# Patient Record
Sex: Male | Born: 1995 | Race: White | Hispanic: No | Marital: Single | Smoking: Never smoker
Health system: Southern US, Community
[De-identification: ages and names within clinical notes are randomized; demographics above are authoritative.]

## PROBLEM LIST (undated history)

## (undated) DIAGNOSIS — J45909 Unspecified asthma, uncomplicated: Secondary | ICD-10-CM

---
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
## 2017-08-16 ENCOUNTER — Emergency Department (HOSPITAL_BASED_OUTPATIENT_CLINIC_OR_DEPARTMENT_OTHER): Payer: 59

## 2017-08-16 ENCOUNTER — Encounter (HOSPITAL_BASED_OUTPATIENT_CLINIC_OR_DEPARTMENT_OTHER): Payer: Self-pay | Admitting: *Deleted

## 2017-08-16 ENCOUNTER — Emergency Department (HOSPITAL_BASED_OUTPATIENT_CLINIC_OR_DEPARTMENT_OTHER)
Admission: EM | Admit: 2017-08-16 | Discharge: 2017-08-16 | Disposition: A | Discharge status: Home / Self Care | Payer: 59 | Attending: Emergency Medicine | Admitting: Emergency Medicine

## 2017-08-16 DIAGNOSIS — S30810A Abrasion of lower back and pelvis, initial encounter: Secondary | ICD-10-CM

## 2017-08-16 DIAGNOSIS — Y999 Unspecified external cause status: Secondary | ICD-10-CM | POA: Insufficient documentation

## 2017-08-16 DIAGNOSIS — Y939 Activity, unspecified: Secondary | ICD-10-CM | POA: Insufficient documentation

## 2017-08-16 DIAGNOSIS — M25522 Pain in left elbow: Secondary | ICD-10-CM | POA: Insufficient documentation

## 2017-08-16 DIAGNOSIS — Y92410 Unspecified street and highway as the place of occurrence of the external cause: Secondary | ICD-10-CM | POA: Diagnosis not present

## 2017-08-16 DIAGNOSIS — R0602 Shortness of breath: Secondary | ICD-10-CM | POA: Diagnosis not present

## 2017-08-16 DIAGNOSIS — S3992XA Unspecified injury of lower back, initial encounter: Secondary | ICD-10-CM | POA: Diagnosis present

## 2017-08-16 HISTORY — DX: Unspecified asthma, uncomplicated: J45.909

## 2017-08-16 MED ORDER — IBUPROFEN 800 MG PO TABS
800.0000 mg | ORAL_TABLET | Freq: Once | ORAL | Status: AC
  Administered 2017-08-16: 800 mg via ORAL
  Filled 2017-08-16: qty 1

## 2017-08-16 MED ORDER — TRAMADOL HCL 50 MG PO TABS: 50.0000 mg | ORAL_TABLET | Freq: Four times a day (QID) | ORAL | 0 refills | Status: DC | PRN

## 2017-08-16 MED ORDER — SILVER SULFADIAZINE 1 % EX CREA: 1.0000 "application " | TOPICAL_CREAM | Freq: Every day | CUTANEOUS | 0 refills | Status: AC

## 2017-08-16 MED ORDER — IBUPROFEN 800 MG PO TABS: 800.0000 mg | ORAL_TABLET | Freq: Three times a day (TID) | ORAL | 0 refills | Status: AC | PRN

## 2017-08-16 NOTE — Discharge Instructions (Signed)
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
## 2021-04-16 ENCOUNTER — Other Ambulatory Visit: Payer: Self-pay

## 2021-04-16 ENCOUNTER — Emergency Department (HOSPITAL_COMMUNITY): Payer: Worker's Compensation | Admitting: Anesthesiology

## 2021-04-16 ENCOUNTER — Encounter (HOSPITAL_COMMUNITY)
Admission: EM | Disposition: A | Discharge status: Home / Self Care | Payer: Self-pay | Source: Home / Self Care | Attending: Emergency Medicine

## 2021-04-16 ENCOUNTER — Ambulatory Visit (HOSPITAL_COMMUNITY)
Admission: EM | Admit: 2021-04-16 | Discharge: 2021-04-16 | Disposition: A | Discharge status: Home / Self Care | Payer: Worker's Compensation | Attending: Emergency Medicine | Admitting: Emergency Medicine

## 2021-04-16 ENCOUNTER — Emergency Department (HOSPITAL_COMMUNITY): Payer: Worker's Compensation

## 2021-04-16 DIAGNOSIS — Z880 Allergy status to penicillin: Secondary | ICD-10-CM | POA: Diagnosis not present

## 2021-04-16 DIAGNOSIS — S62633B Displaced fracture of distal phalanx of left middle finger, initial encounter for open fracture: Secondary | ICD-10-CM | POA: Insufficient documentation

## 2021-04-16 DIAGNOSIS — Z23 Encounter for immunization: Secondary | ICD-10-CM | POA: Diagnosis not present

## 2021-04-16 DIAGNOSIS — W230XXA Caught, crushed, jammed, or pinched between moving objects, initial encounter: Secondary | ICD-10-CM | POA: Diagnosis not present

## 2021-04-16 DIAGNOSIS — Z20822 Contact with and (suspected) exposure to covid-19: Secondary | ICD-10-CM | POA: Diagnosis not present

## 2021-04-16 HISTORY — PX: I & D EXTREMITY: SHX5045

## 2021-04-16 LAB — RESP PANEL BY RT-PCR (FLU A&B, COVID) ARPGX2
Influenza A by PCR: NEGATIVE
Influenza B by PCR: NEGATIVE
SARS Coronavirus 2 by RT PCR: NEGATIVE

## 2021-04-16 SURGERY — IRRIGATION AND DEBRIDEMENT EXTREMITY
Anesthesia: General | Laterality: Left

## 2021-04-16 MED ORDER — PHENYLEPHRINE 40 MCG/ML (10ML) SYRINGE FOR IV PUSH (FOR BLOOD PRESSURE SUPPORT)
PREFILLED_SYRINGE | INTRAVENOUS | Status: DC | PRN
  Administered 2021-04-16: 80 ug via INTRAVENOUS

## 2021-04-16 MED ORDER — VANCOMYCIN HCL 1000 MG/200ML IV SOLN
1000.0000 mg | INTRAVENOUS | Status: AC
  Administered 2021-04-16: 1000 mg via INTRAVENOUS
  Filled 2021-04-16: qty 200

## 2021-04-16 MED ORDER — MIDAZOLAM HCL 2 MG/2ML IJ SOLN
INTRAMUSCULAR | Status: DC | PRN
  Administered 2021-04-16: 2 mg via INTRAVENOUS

## 2021-04-16 MED ORDER — MORPHINE SULFATE (PF) 4 MG/ML IV SOLN
4.0000 mg | Freq: Once | INTRAVENOUS | Status: AC
  Administered 2021-04-16: 4 mg via INTRAVENOUS
  Filled 2021-04-16: qty 1

## 2021-04-16 MED ORDER — ONDANSETRON HCL 4 MG/2ML IJ SOLN
INTRAMUSCULAR | Status: AC
  Filled 2021-04-16: qty 2

## 2021-04-16 MED ORDER — 0.9 % SODIUM CHLORIDE (POUR BTL) OPTIME
TOPICAL | Status: DC | PRN
  Administered 2021-04-16: 1000 mL

## 2021-04-16 MED ORDER — PROPOFOL 10 MG/ML IV BOLUS
INTRAVENOUS | Status: DC | PRN
  Administered 2021-04-16: 200 mg via INTRAVENOUS

## 2021-04-16 MED ORDER — PROPOFOL 10 MG/ML IV BOLUS
INTRAVENOUS | Status: AC
  Filled 2021-04-16: qty 20

## 2021-04-16 MED ORDER — LIDOCAINE 2% (20 MG/ML) 5 ML SYRINGE
INTRAMUSCULAR | Status: DC | PRN
  Administered 2021-04-16: 100 mg via INTRAVENOUS

## 2021-04-16 MED ORDER — ONDANSETRON HCL 4 MG/2ML IJ SOLN
INTRAMUSCULAR | Status: DC | PRN
  Administered 2021-04-16: 4 mg via INTRAVENOUS

## 2021-04-16 MED ORDER — FENTANYL CITRATE (PF) 250 MCG/5ML IJ SOLN
INTRAMUSCULAR | Status: AC
Start: 2021-04-16 — End: ?
  Filled 2021-04-16: qty 5

## 2021-04-16 MED ORDER — CEFAZOLIN SODIUM-DEXTROSE 1-4 GM/50ML-% IV SOLN
1.0000 g | Freq: Once | INTRAVENOUS | Status: AC
  Administered 2021-04-16: 1 g via INTRAVENOUS
  Filled 2021-04-16: qty 50

## 2021-04-16 MED ORDER — FENTANYL CITRATE (PF) 250 MCG/5ML IJ SOLN
INTRAMUSCULAR | Status: DC | PRN
  Administered 2021-04-16 (×2): 50 ug via INTRAVENOUS

## 2021-04-16 MED ORDER — TETANUS-DIPHTH-ACELL PERTUSSIS 5-2.5-18.5 LF-MCG/0.5 IM SUSY
0.5000 mL | PREFILLED_SYRINGE | Freq: Once | INTRAMUSCULAR | Status: AC
  Administered 2021-04-16: 0.5 mL via INTRAMUSCULAR
  Filled 2021-04-16: qty 0.5

## 2021-04-16 MED ORDER — HYDROCODONE-ACETAMINOPHEN 5-325 MG PO TABS: ORAL_TABLET | ORAL | 0 refills | Status: AC

## 2021-04-16 MED ORDER — DEXAMETHASONE SODIUM PHOSPHATE 10 MG/ML IJ SOLN
INTRAMUSCULAR | Status: AC
  Filled 2021-04-16: qty 1

## 2021-04-16 MED ORDER — EPHEDRINE SULFATE-NACL 50-0.9 MG/10ML-% IV SOSY
PREFILLED_SYRINGE | INTRAVENOUS | Status: DC | PRN
  Administered 2021-04-16 (×2): 5 mg via INTRAVENOUS

## 2021-04-16 MED ORDER — BUPIVACAINE HCL (PF) 0.25 % IJ SOLN
INTRAMUSCULAR | Status: AC
  Filled 2021-04-16: qty 30

## 2021-04-16 MED ORDER — LACTATED RINGERS IV SOLN: INTRAVENOUS | Status: DC | PRN

## 2021-04-16 MED ORDER — DEXAMETHASONE SODIUM PHOSPHATE 10 MG/ML IJ SOLN
INTRAMUSCULAR | Status: DC | PRN
  Administered 2021-04-16: 10 mg via INTRAVENOUS

## 2021-04-16 MED ORDER — BUPIVACAINE HCL (PF) 0.25 % IJ SOLN
INTRAMUSCULAR | Status: DC | PRN
  Administered 2021-04-16: 10 mL

## 2021-04-16 MED ORDER — SODIUM CHLORIDE 0.9 % IV SOLN: INTRAVENOUS | Status: DC

## 2021-04-16 MED ORDER — MIDAZOLAM HCL 2 MG/2ML IJ SOLN
INTRAMUSCULAR | Status: AC
  Filled 2021-04-16: qty 2

## 2021-04-16 MED ORDER — SULFAMETHOXAZOLE-TRIMETHOPRIM 800-160 MG PO TABS: 1.0000 | ORAL_TABLET | Freq: Two times a day (BID) | ORAL | 0 refills | Status: AC

## 2021-04-16 SURGICAL SUPPLY — 77 items
ADAPTER CATH SYR TO TUBING 38M (ADAPTER) ×1 IMPLANT
ADPR CATH LL SYR 3/32 TPR (ADAPTER)
APL PRP STRL LF DISP 70% ISPRP (MISCELLANEOUS) ×1
APL SKNCLS STERI-STRIP NONHPOA (GAUZE/BANDAGES/DRESSINGS)
BENZOIN TINCTURE PRP APPL 2/3 (GAUZE/BANDAGES/DRESSINGS) ×1 IMPLANT
BLADE CLIPPER SURG (BLADE) ×2 IMPLANT
BNDG CMPR 9X4 STRL LF SNTH (GAUZE/BANDAGES/DRESSINGS) ×1
BNDG COHESIVE 2X5 TAN STRL LF (GAUZE/BANDAGES/DRESSINGS) IMPLANT
BNDG COHESIVE 3X5 TAN STRL LF (GAUZE/BANDAGES/DRESSINGS) ×1 IMPLANT
BNDG ELASTIC 3X5.8 VLCR STR LF (GAUZE/BANDAGES/DRESSINGS) ×2 IMPLANT
BNDG ELASTIC 4X5.8 VLCR STR LF (GAUZE/BANDAGES/DRESSINGS) ×2 IMPLANT
BNDG ESMARK 4X9 LF (GAUZE/BANDAGES/DRESSINGS) ×2 IMPLANT
BNDG GAUZE ELAST 4 BULKY (GAUZE/BANDAGES/DRESSINGS) ×2 IMPLANT
CANNULA VESSEL 3MM 2 BLNT TIP (CANNULA) IMPLANT
CHLORAPREP W/TINT 26 (MISCELLANEOUS) ×2 IMPLANT
CORD BIPOLAR FORCEPS 12FT (ELECTRODE) ×2 IMPLANT
COVER SURGICAL LIGHT HANDLE (MISCELLANEOUS) ×1 IMPLANT
COVER WAND RF STERILE (DRAPES) ×2 IMPLANT
CUFF TOURN SGL QUICK 18X4 (TOURNIQUET CUFF) ×1 IMPLANT
CUFF TOURN SGL QUICK 24 (TOURNIQUET CUFF)
CUFF TRNQT CYL 24X4X16.5-23 (TOURNIQUET CUFF) IMPLANT
DECANTER SPIKE VIAL GLASS SM (MISCELLANEOUS) ×2 IMPLANT
DRAIN PENROSE 1/4X12 LTX STRL (WOUND CARE) IMPLANT
DRAPE 3/4 80X56 (DRAPES) ×1 IMPLANT
DRAPE OEC MINIVIEW 54X84 (DRAPES) ×3 IMPLANT
DRAPE SURG 17X23 STRL (DRAPES) ×2 IMPLANT
DRSG EMULSION OIL 3X3 NADH (GAUZE/BANDAGES/DRESSINGS) ×2 IMPLANT
DRSG PAD ABDOMINAL 8X10 ST (GAUZE/BANDAGES/DRESSINGS) ×2 IMPLANT
DRSG XEROFORM 1X8 (GAUZE/BANDAGES/DRESSINGS) ×1 IMPLANT
GAUZE SPONGE 4X4 12PLY STRL (GAUZE/BANDAGES/DRESSINGS) ×3 IMPLANT
GAUZE XEROFORM 1X8 LF (GAUZE/BANDAGES/DRESSINGS) ×3 IMPLANT
GLOVE BIO SURGEON STRL SZ7.5 (GLOVE) ×2 IMPLANT
GLOVE BIOGEL PI IND STRL 8.5 (GLOVE) IMPLANT
GLOVE BIOGEL PI INDICATOR 8.5 (GLOVE) ×1
GLOVE BIOGEL PI ORTHO PRO SZ8 (GLOVE)
GLOVE PI ORTHO PRO STRL SZ8 (GLOVE) IMPLANT
GLOVE SRG 8 PF TXTR STRL LF DI (GLOVE) ×1 IMPLANT
GLOVE SURG ORTHO 8.0 STRL STRW (GLOVE) IMPLANT
GLOVE SURG UNDER POLY LF SZ8 (GLOVE) ×2
GOWN STRL REUS W/ TWL LRG LVL3 (GOWN DISPOSABLE) ×3 IMPLANT
GOWN STRL REUS W/ TWL XL LVL3 (GOWN DISPOSABLE) ×1 IMPLANT
GOWN STRL REUS W/TWL LRG LVL3 (GOWN DISPOSABLE) ×4
GOWN STRL REUS W/TWL XL LVL3 (GOWN DISPOSABLE) ×2
KIT BASIN OR (CUSTOM PROCEDURE TRAY) ×2 IMPLANT
KIT TURNOVER KIT B (KITS) ×2 IMPLANT
LOOP VESSEL MAXI BLUE (MISCELLANEOUS) IMPLANT
MANIFOLD NEPTUNE II (INSTRUMENTS) ×1 IMPLANT
NDL HYPO 25GX1X1/2 BEV (NEEDLE) ×1 IMPLANT
NDL HYPO 25X1 1.5 SAFETY (NEEDLE) IMPLANT
NEEDLE HYPO 25GX1X1/2 BEV (NEEDLE) ×2 IMPLANT
NEEDLE HYPO 25X1 1.5 SAFETY (NEEDLE) IMPLANT
NS IRRIG 1000ML POUR BTL (IV SOLUTION) ×2 IMPLANT
PACK ORTHO EXTREMITY (CUSTOM PROCEDURE TRAY) ×2 IMPLANT
PAD ARMBOARD 7.5X6 YLW CONV (MISCELLANEOUS) ×4 IMPLANT
SET CYSTO W/LG BORE CLAMP LF (SET/KITS/TRAYS/PACK) IMPLANT
SOL PREP POV-IOD 4OZ 10% (MISCELLANEOUS) ×4 IMPLANT
SPONGE LAP 4X18 RFD (DISPOSABLE) ×2 IMPLANT
STRIP CLOSURE SKIN 1/2X4 (GAUZE/BANDAGES/DRESSINGS) ×2 IMPLANT
SUCTION FRAZIER HANDLE 10FR (MISCELLANEOUS) ×2
SUCTION TUBE FRAZIER 10FR DISP (MISCELLANEOUS) ×1 IMPLANT
SUT CHROMIC 6 0 PS 4 (SUTURE) ×1 IMPLANT
SUT ETHILON 4 0 P 3 18 (SUTURE) IMPLANT
SUT ETHILON 4 0 PS 2 18 (SUTURE) IMPLANT
SUT MON AB 5-0 P3 18 (SUTURE) IMPLANT
SUT MON AB 5-0 PS2 18 (SUTURE) ×1 IMPLANT
SUT PROLENE 4 0 P 3 18 (SUTURE) IMPLANT
SWAB COLLECTION DEVICE MRSA (MISCELLANEOUS) IMPLANT
SWAB CULTURE ESWAB REG 1ML (MISCELLANEOUS) IMPLANT
SYR 20ML LL LF (SYRINGE) ×1 IMPLANT
SYR CONTROL 10ML LL (SYRINGE) ×2 IMPLANT
TOWEL GREEN STERILE (TOWEL DISPOSABLE) ×2 IMPLANT
TOWEL GREEN STERILE FF (TOWEL DISPOSABLE) ×2 IMPLANT
TUBE CONNECTING 12X1/4 (SUCTIONS) ×2 IMPLANT
TUBE FEEDING ENTERAL 5FR 16IN (TUBING) IMPLANT
UNDERPAD 30X36 HEAVY ABSORB (UNDERPADS AND DIAPERS) ×2 IMPLANT
WATER STERILE IRR 1000ML POUR (IV SOLUTION) ×2 IMPLANT
YANKAUER SUCT BULB TIP NO VENT (SUCTIONS) ×2 IMPLANT

## 2021-04-16 NOTE — Transfer of Care (Signed)
Immediate Anesthesia Transfer of Care Note  Patient: Kenneth Moses  Procedure(s) Performed: IRRIGATION AND DEBRIDEMENT LEFT MIDDLE FINGER REPAIR OF SKIN AND NAIL BED (Left )  Patient Location: PACU  Anesthesia Type:General  Level of Consciousness: awake, alert  and patient cooperative  Airway & Oxygen Therapy: Patient Spontanous Breathing and Patient connected to face mask oxygen  Post-op Assessment: Report given to RN and Post -op Vital signs reviewed and stable  Post vital signs: Reviewed and stable  Last Vitals:  Vitals Value Taken Time  BP 143/94 04/16/21 0906  Temp    Pulse 94 04/16/21 0907  Resp 14 04/16/21 0907  SpO2 100 % 04/16/21 0907  Vitals shown include unvalidated device data.  Last Pain:  Vitals:   04/16/21 0204  TempSrc:   PainSc: 3          Complications: No complications documented.

## 2021-04-16 NOTE — Progress Notes (Signed)
Orthopedic Tech Progress Note Patient Details:  Kenneth Moses Jun 25, 1996 [id number] Left sling at bedside Ortho Devices Type of Ortho Device: Arm sling Ortho Device/Splint Interventions: Ordered       Arnold Depinto A Isebella Upshur 04/16/2021, 10:03 AM

## 2021-04-16 NOTE — Op Note (Signed)
NAMERenly Roots MEDICAL RECORD NO: [id number] DATE OF BIRTH: October 17, 1996 FACILITY: [facility] LOCATION: [facility] OR PHYSICIAN: Tami Ribas, MD   OPERATIVE REPORT   DATE OF PROCEDURE: 04/16/21    PREOPERATIVE DIAGNOSIS:  left long finger crush injury   POSTOPERATIVE DIAGNOSIS:   1.  Left long finger open distal phalanx fracture 2.  Left long finger skin and nail bed lacerations   PROCEDURE:   1.  Irrigation and debridement left long finger open distal phalanx fracture 2. Open reduction left long finger open distal phalanx fracture 3. Left long finger repair skin and nail bed lacerations   SURGEON:  Betha Loa, M.D.   ASSISTANT: none   ANESTHESIA:  General   INTRAVENOUS FLUIDS:  Per anesthesia flow sheet.   ESTIMATED BLOOD LOSS:  Minimal.   COMPLICATIONS:  None.   SPECIMENS:  none   TOURNIQUET TIME:    Total Tourniquet Time Documented: Upper Arm (Left) - 40 minutes Total: Upper Arm (Left) - 40 minutes    DISPOSITION:  Stable to PACU.   INDICATIONS:  25 yo rhd male states he crushed left long finger with a tool at work early this morning.  Seen at [facility] where XR revealed distal phalanx fracture with comminution.  Skin and nail bed lacerations.    Recommended operative reduction and repair in the operating room with possible pinning.  Risks, benefits and alternatives of surgery were discussed including the risks of blood loss, infection, damage to nerves, vessels, tendons, ligaments, bone for surgery, need for additional surgery, complications with wound healing, continued pain, nonunion, malunion,  stiffness.  He voiced understanding of these risks and elected to proceed.  OPERATIVE COURSE:  After being identified preoperatively by myself,  the patient and I agreed on the procedure and site of the procedure.  The surgical site was marked.  Surgical consent had been signed. He was given IV antibiotics as preoperative antibiotic prophylaxis. He was transferred to the operating  room and placed on the operating table in supine position with the Left upper extremity on an arm board.  General anesthesia was induced by the anesthesiologist.  Left upper extremity was prepped and draped in normal sterile orthopedic fashion.  A surgical pause was performed between the surgeons, anesthesia, and operating room staff and all were in agreement as to the patient, procedure, and site of procedure.  Tourniquet at the proximal aspect of the extremity was inflated to 250 mmHg after exsanguination of the arm with an Esmarch bandage.  A digital block was performed with 10 cc of quarter percent plain Marcaine.  The wound was explored.  There was no gross contamination.  The pickups and knife were used to debride the open fracture including skin subcutaneous tissues and tissue deep to fascia.  The knife was used to remove any devitalized tissue.  Contaminated hematoma was removed.  The wound was copiously irrigated with sterile saline by bulb syringe.  A reduction of the distal phalanx fracture was performed under direct visualization.  There was too much comminution of the distal fragments were pending.  The skin was reapproximated using 5-0 Monocryl in an interrupted fashion.  6-0 chromic suture was used to reapproximate the nailbed.  Good tension-free reapproximation was obtained.  Two relaxing incisions were made in the dorsal nail fold to allow visualization.  These were repaired with the 6-0 chromic as well.  A piece of Xeroform was placed in the nail fold and the wound dressed with sterile Xeroform and 4 x 4 and  wrapped with a Coban dressing lightly.  An AlumaFoam splint was placed and wrapped lightly with Coban dressing.  The tourniquet was deflated at 40 minutes.  Fingertips were pink with brisk capillary refill after deflation of tourniquet.  The operative  drapes were broken down.  The patient was awoken from anesthesia safely.  He was transferred back to the stretcher and taken to PACU in stable  condition.  I will see him back in the office in 1 week for postoperative followup.  I will give him a prescription for Norco 5/325 1-2 tabs PO q6 hours prn pain, dispense # 20 and Bactrim DS 1 p.o. twice daily x7 days.  Debridement type: Excisional Debridement  Side: left  Body Location: Long finger  Tools used for debridement: scalpel  Debridement depth beyond dead/damaged tissue down to healthy viable tissue: yes  Tissue layer involved: skin, subcutaneous tissue, muscle / fascia  Nature of tissue removed: Devitalized Tissue  Irrigation volume: 800 cc     Irrigation fluid type: Normal Saline   Betha Loa, MD Electronically signed, 04/16/21

## 2021-04-16 NOTE — ED Notes (Signed)
OR came to pick pt up.  All belongings given to mom.  Fluids stopped per provider.

## 2021-04-16 NOTE — ED Notes (Signed)
Pt has been changed into a gown, belongings are bagged and labeled bedside.  Pt last ate 1/2 peanut butter sandwich about midnight.  He has called his Dad and arranged a ride home after the surgery.

## 2021-04-16 NOTE — ED Notes (Signed)
Report given to Danae Orleans

## 2021-04-16 NOTE — Anesthesia Postprocedure Evaluation (Signed)
Anesthesia Post Note  Patient: Kenneth Moses  Procedure(s) Performed: IRRIGATION AND DEBRIDEMENT LEFT MIDDLE FINGER REPAIR OF SKIN AND NAIL BED (Left )     Patient location during evaluation: PACU Anesthesia Type: General Level of consciousness: awake and alert and oriented Pain management: pain level controlled Vital Signs Assessment: post-procedure vital signs reviewed and stable Respiratory status: spontaneous breathing, nonlabored ventilation and respiratory function stable Cardiovascular status: blood pressure returned to baseline and stable Postop Assessment: no apparent nausea or vomiting Anesthetic complications: no   No complications documented.  Last Vitals:  Vitals:   04/16/21 0921 04/16/21 0936  BP: 131/87 (!) 145/88  Pulse: 92 (!) 101  Resp: 14 15  Temp:  36.4 C  SpO2: 96% 95%    Last Pain:  Vitals:   04/16/21 0936  TempSrc:   PainSc: 0-No pain                 Landynn Dupler A.

## 2021-04-16 NOTE — ED Provider Notes (Signed)
[facility] EMERGENCY DEPARTMENT Provider Note   CSN: [id number] Arrival date & time: 04/16/21  0148     History Chief Complaint  Patient presents with  . Finger Injury    Kace Hartje is a 25 y.o. male.  Patient is a 25 year old male with history of asthma.  He presents with complaints of a left middle finger injury.  Patient was working on an [employer] and the air tool kicked back and injured his fingertip.  Bleeding controlled with direct pressure.  Pain is worse with any movement.  There are no alleviating factors.  The history is provided by the patient.       Past Medical History:  Diagnosis Date  . Asthma     There are no problems to display for this patient.   No past surgical history on file.     No family history on file.  Social History   Tobacco Use  . Smoking status: Never Smoker  . Smokeless tobacco: Never Used  Substance Use Topics  . Alcohol use: No  . Drug use: No    Home Medications Prior to Admission medications   Medication Sig Start Date End Date Taking? Authorizing Provider  ibuprofen (ADVIL,MOTRIN) 800 MG tablet Take 1 tablet (800 mg total) by mouth every 8 (eight) hours as needed. 08/16/17   Long, Arlyss Repress, MD  silver sulfADIAZINE (SILVADENE) 1 % cream Apply 1 application topically daily. 08/16/17   Long, Arlyss Repress, MD  traMADol (ULTRAM) 50 MG tablet Take 1 tablet (50 mg total) by mouth every 6 (six) hours as needed. 08/16/17   Long, Arlyss Repress, MD    Allergies    Penicillins  Review of Systems   Review of Systems  All other systems reviewed and are negative.   Physical Exam Updated Vital Signs BP (!) 147/100 (BP Location: Right Arm)   Pulse 69   Temp 98.4 F (36.9 C) (Oral)   Resp 17   Ht 5\' 11"  (1.803 m)   Wt 95.3 kg   SpO2 98%   BMI 29.29 kg/m   Physical Exam Vitals and nursing note reviewed.  Constitutional:      General: He is not in acute distress.    Appearance: Normal appearance. He is not  ill-appearing.  HENT:     Head: Normocephalic and atraumatic.  Pulmonary:     Effort: Pulmonary effort is normal.  Musculoskeletal:     Comments: The left middle finger has a fingertip avulsion.  The base of the nail has been avulsed and the tip of the finger is unstable  Skin:    General: Skin is warm and dry.  Neurological:     Mental Status: He is alert.     ED Results / Procedures / Treatments   Labs (all labs ordered are listed, but only abnormal results are displayed) Labs Reviewed - No data to display  EKG None  Radiology DG Finger Middle Left  Result Date: 04/16/2021 CLINICAL DATA:  Crush injury third digit EXAM: LEFT MIDDLE FINGER 2+V COMPARISON:  None. FINDINGS: Frontal, oblique, and lateral views of the left third digit are obtained. There is an open comminuted fracture of the distal tuft of the third distal phalanx. Moderate separation of the fracture fragments, with dorsal angulation at the fracture site. Large dorsal soft tissue defect. No other acute bony abnormalities. IMPRESSION: 1. Open comminuted fracture of the third distal phalanx. Electronically Signed   By: 06/16/2021 M.D.   On: 04/16/2021 02:28  Procedures Procedures   Medications Ordered in ED Medications - No data to display  ED Course  I have reviewed the triage vital signs and the nursing notes.  Pertinent labs & imaging results that were available during my care of the patient were reviewed by me and considered in my medical decision making (see chart for details).    MDM Rules/Calculators/A&P  Patient presenting with injury to left middle finger.  He has an open fracture of the distal phalanx with avulsion of the nailbed.  X-rays confirm fracture.  The tip of the finger is somewhat unstable.  Care has been discussed with Dr. Merlyn Lot from hand surgery.  He is recommending keeping the patient in the ER and will see about repair in the OR in the morning.  Patient given Kefzol and tetanus  updated.  Final Clinical Impression(s) / ED Diagnoses Final diagnoses:  None    Rx / DC Orders ED Discharge Orders    None       Geoffery Lyons, MD 04/16/21 [phone]

## 2021-04-16 NOTE — Discharge Instructions (Addendum)

## 2021-04-16 NOTE — H&P (Signed)
Kenneth Moses is an 25 y.o. male.   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  Seen at [facility] where XR revealed distal phalanx fracture.  He reports no previous injury to the finger and no other injury at this time.  There is associated wound and bleeding of left long finger  Case discussed with Geoffery Lyons, MD and his note from 04/16/2021 reviewed. Xrays viewed and interpreted by me: ap/lateral views left long finger show distal phalanx tuft fracture with comminution. Labs reviewed: none  Allergies:  Allergies  Allergen Reactions  . Penicillins     Past Medical History:  Diagnosis Date  . Asthma     No past surgical history on file.  Family History: No family history on file.  Social History:   reports that he has never smoked. He has never used smokeless tobacco. He reports that he does not drink alcohol and does not use drugs.  Medications: Medications Prior to Admission  Medication Sig Dispense Refill  . ibuprofen (ADVIL,MOTRIN) 800 MG tablet Take 1 tablet (800 mg total) by mouth every 8 (eight) hours as needed. 21 tablet 0  . silver sulfADIAZINE (SILVADENE) 1 % cream Apply 1 application topically daily. 50 g 0  . traMADol (ULTRAM) 50 MG tablet Take 1 tablet (50 mg total) by mouth every 6 (six) hours as needed. 15 tablet 0    Results for orders placed or performed during the hospital encounter of 04/16/21 (from the past 48 hour(s))  Resp Panel by RT-PCR (Flu A&B, Covid) Nasopharyngeal Swab     Status: None   Collection Time: 04/16/21  5:26 AM   Specimen: Nasopharyngeal Swab; Nasopharyngeal(NP) swabs in vial transport medium  Result Value Ref Range   SARS Coronavirus 2 by RT PCR NEGATIVE NEGATIVE    Comment: (NOTE) SARS-CoV-2 target nucleic acids are NOT DETECTED.  The SARS-CoV-2 RNA is generally detectable in upper respiratory specimens during the acute phase of infection. The  lowest concentration of SARS-CoV-2 viral copies this assay can detect is 138 copies/mL. A negative result does not preclude SARS-Cov-2 infection and should not be used as the sole basis for treatment or other patient management decisions. A negative result may occur with  improper specimen collection/handling, submission of specimen other than nasopharyngeal swab, presence of viral mutation(s) within the areas targeted by this assay, and inadequate number of viral copies(<138 copies/mL). A negative result must be combined with clinical observations, patient history, and epidemiological information. The expected result is Negative.  Fact Sheet for Patients:  BloggerCourse.com  Fact Sheet for Healthcare Providers:  SeriousBroker.it  This test is no t yet approved or cleared by the [place] FDA and  has been authorized for detection and/or diagnosis of SARS-CoV-2 by FDA under an Emergency Use Authorization (EUA). This EUA will remain  in effect (meaning this test can be used) for the duration of the COVID-19 declaration under Section 564(b)(1) of the Act, 21 U.S.C.section 360bbb-3(b)(1), unless the authorization is terminated  or revoked sooner.       Influenza A by PCR NEGATIVE NEGATIVE   Influenza B by PCR NEGATIVE NEGATIVE    Comment: (NOTE) The Xpert Xpress SARS-CoV-2/FLU/RSV plus assay is intended as an aid in the diagnosis of influenza from Nasopharyngeal swab specimens and should not be used as a sole basis for treatment. Nasal washings and aspirates are unacceptable for Xpert Xpress SARS-CoV-2/FLU/RSV testing.  Fact Sheet  for Patients: BloggerCourse.com  Fact Sheet for Healthcare Providers: SeriousBroker.it  This test is not yet approved or cleared by the [place] FDA and has been authorized for detection and/or diagnosis of SARS-CoV-2 by FDA under an Emergency  Use Authorization (EUA). This EUA will remain in effect (meaning this test can be used) for the duration of the COVID-19 declaration under Section 564(b)(1) of the Act, 21 U.S.C. section 360bbb-3(b)(1), unless the authorization is terminated or revoked.  Performed at [facility], [address], [place], [place] [address]     DG Finger Middle Left  Result Date: 04/16/2021 CLINICAL DATA:  Crush injury third digit EXAM: LEFT MIDDLE FINGER 2+V COMPARISON:  None. FINDINGS: Frontal, oblique, and lateral views of the left third digit are obtained. There is an open comminuted fracture of the distal tuft of the third distal phalanx. Moderate separation of the fracture fragments, with dorsal angulation at the fracture site. Large dorsal soft tissue defect. No other acute bony abnormalities. IMPRESSION: 1. Open comminuted fracture of the third distal phalanx. Electronically Signed   By: Sharlet Salina M.D.   On: 04/16/2021 02:28     A comprehensive review of systems was negative. Review of Systems: No fevers, chills, night sweats, chest pain, shortness of breath, nausea, vomiting, diarrhea, constipation, easy bleeding or bruising, headaches, dizziness, vision changes, fainting.   Blood pressure 132/86, pulse 73, temperature 98.4 F (36.9 C), temperature source Oral, resp. rate 16, height 5\' 11"  (1.803 m), weight 95.3 kg, SpO2 98 %.  General appearance: alert, cooperative and appears stated age Head: Normocephalic, without obvious abnormality, atraumatic Neck: supple, symmetrical, trachea midline Resp: clear to auscultation bilaterally Cardio: regular rate and rhythm Extremities: Intact sensation and capillary refill all digits.  +epl/fpl/io.  Wound of left long finger including nail bed and radial and ulnar tissues.  Able to feel light touch in tip of finger.  Intact capillary refill. Pulses: 2+ and symmetric Skin: Skin color, texture, turgor normal. No rashes or lesions Neurologic:  Grossly normal Incision/Wound: as above  Assessment/Plan Left long finger crush injury.  Recommend OR for irrigation and debridement possible pinning with repair skin and nail bed left long finger.  Risks, benefits and alternatives of surgery were discussed including risks of blood loss, infection, damage to nerves/vessels/tendons/ligament/bone, failure of surgery, need for additional surgery, complication with wound healing, stiffness, nonunion, malunion.  He voiced understanding of these risks and elected to proceed.    04/16/2021, 7:37 AM

## 2021-04-16 NOTE — ED Triage Notes (Signed)
Left middle finger got crushed while at work. Finger got stuck in between the gun and engine (works as [employer]).   Updated on tetanus.

## 2021-04-16 NOTE — Anesthesia Preprocedure Evaluation (Signed)
Anesthesia Evaluation  Patient identified by MRN, date of birth, ID band Patient awake    Reviewed: Allergy & Precautions, NPO status , Patient's Chart, lab work & pertinent test results  Airway Mallampati: II  TM Distance: >3 FB Neck ROM: Full    Dental no notable dental hx. (+) Teeth Intact   Pulmonary asthma ,    Pulmonary exam normal breath sounds clear to auscultation       Cardiovascular negative cardio ROS Normal cardiovascular exam Rhythm:Regular Rate:Normal     Neuro/Psych negative neurological ROS  negative psych ROS   GI/Hepatic negative GI ROS, Neg liver ROS,   Endo/Other  negative endocrine ROS  Renal/GU negative Renal ROS  negative genitourinary   Musculoskeletal Crush injury left middle finger   Abdominal   Peds  Hematology negative hematology ROS (+)   Anesthesia Other Findings   Reproductive/Obstetrics                             Anesthesia Physical Anesthesia Plan  ASA: II  Anesthesia Plan: General   Post-op Pain Management:    Induction: Intravenous  PONV Risk Score and Plan: 3 and Treatment may vary due to age or medical condition, Midazolam and Ondansetron  Airway Management Planned: LMA  Additional Equipment:   Intra-op Plan:   Post-operative Plan: Extubation in OR  Informed Consent: I have reviewed the patients History and Physical, chart, labs and discussed the procedure including the risks, benefits and alternatives for the proposed anesthesia with the patient or authorized representative who has indicated his/her understanding and acceptance.     Dental advisory given  Plan Discussed with: CRNA and Anesthesiologist  Anesthesia Plan Comments:         Anesthesia Quick Evaluation

## 2021-04-16 NOTE — Anesthesia Procedure Notes (Signed)
Procedure Name: LMA Insertion Date/Time: 04/16/2021 7:50 AM Performed by: Carlos American, CRNA Pre-anesthesia Checklist: Patient identified, Emergency Drugs available, Suction available and Patient being monitored Patient Re-evaluated:Patient Re-evaluated prior to induction Oxygen Delivery Method: Circle System Utilized Preoxygenation: Pre-oxygenation with 100% oxygen Induction Type: IV induction Ventilation: Mask ventilation without difficulty LMA: LMA inserted LMA Size: 5.0 Number of attempts: 1 Placement Confirmation: positive ETCO2 Tube secured with: Tape Dental Injury: Teeth and Oropharynx as per pre-operative assessment

## 2021-04-17 ENCOUNTER — Encounter (HOSPITAL_COMMUNITY): Payer: Self-pay | Admitting: Orthopedic Surgery

## 2022-08-05 IMAGING — DX DG FINGER MIDDLE 2+V*L*
3 series · 3 of 3 positions shown · non-contrast
Comparison: None.

CLINICAL DATA: Crush injury third digit

EXAM:
LEFT MIDDLE FINGER 2+V

[finger ap]
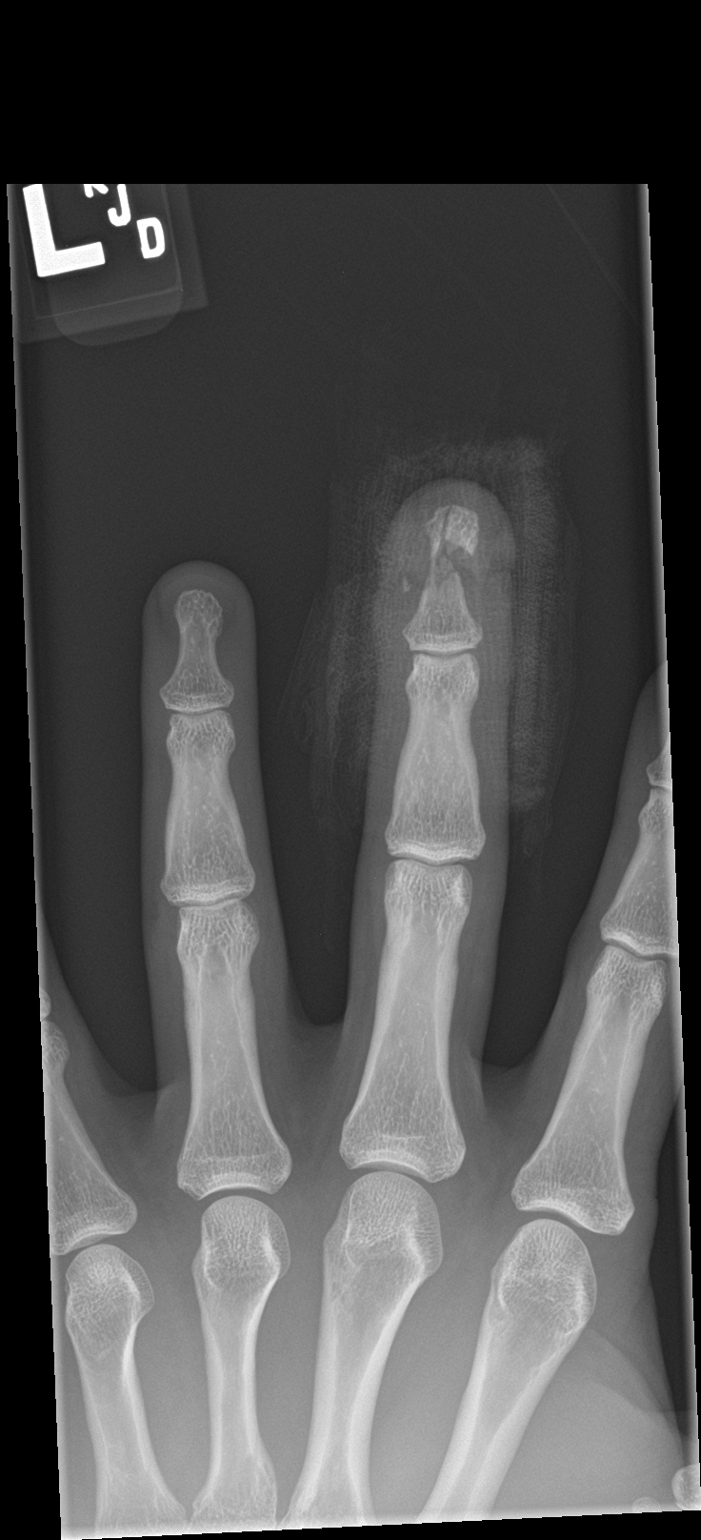

[finger obl]
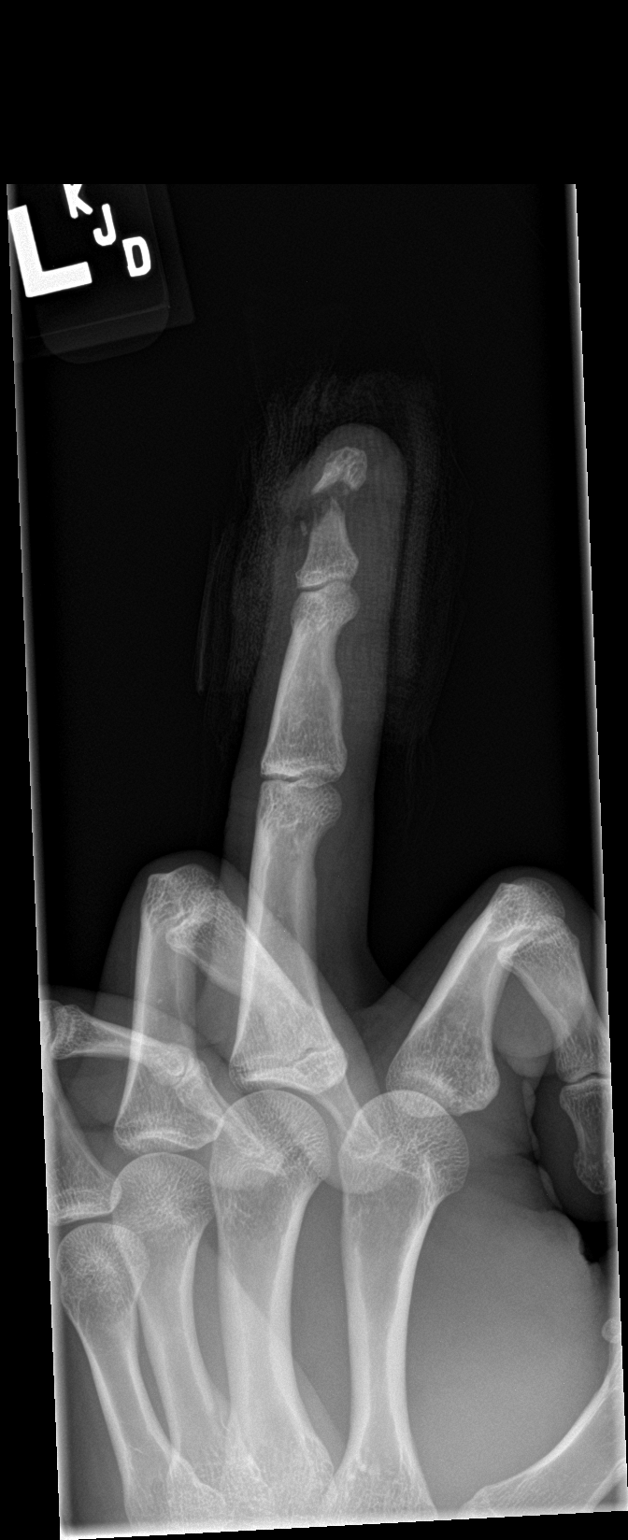

[finger lat]
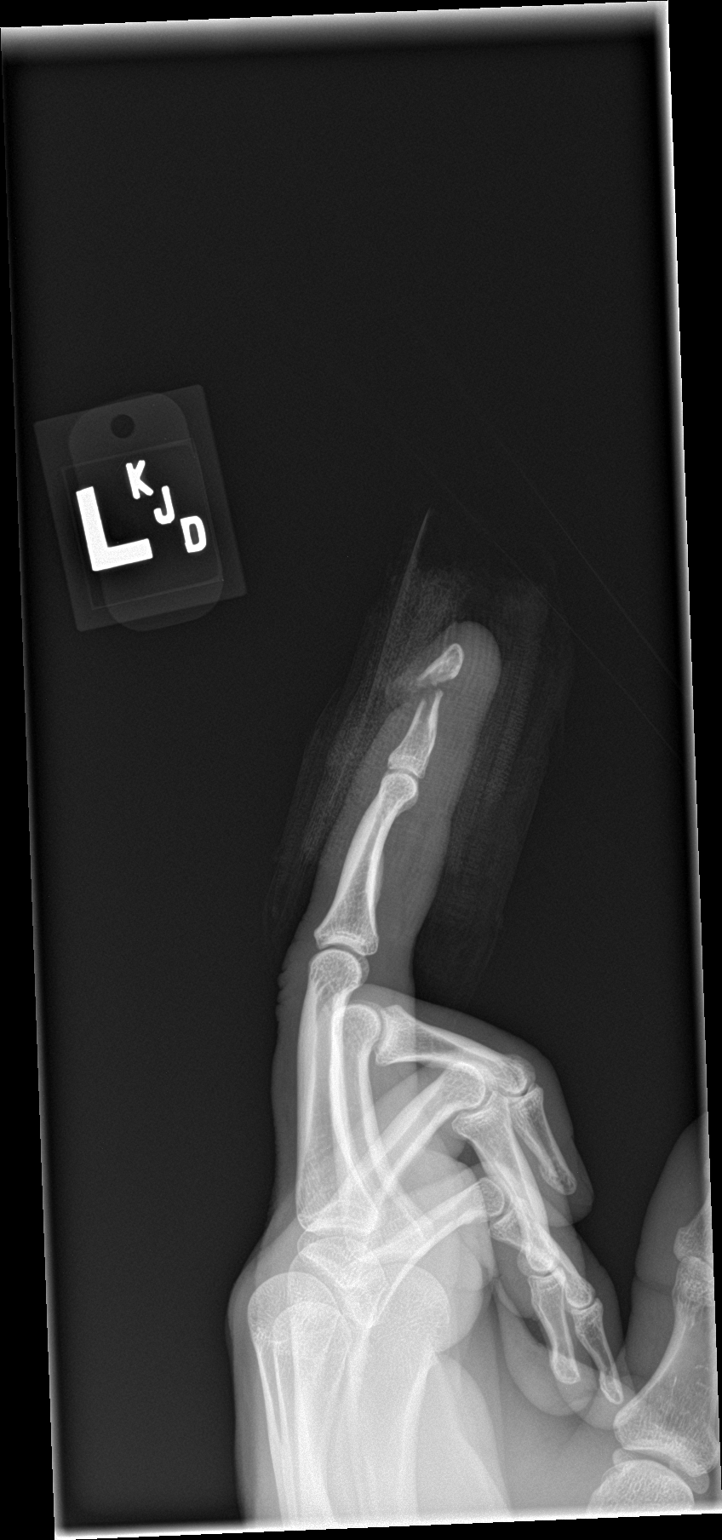

[3 of 3 positions shown; findings below may reference images not displayed]

FINDINGS: Frontal, oblique, and lateral views of the left third digit are
obtained. There is an open comminuted fracture of the distal tuft of
the third distal phalanx. Moderate separation of the fracture
fragments, with dorsal angulation at the fracture site. Large dorsal
soft tissue defect. No other acute bony abnormalities.
IMPRESSION: 1. Open comminuted fracture of the third distal phalanx.
